# Patient Record
Sex: Female | Born: 1990 | Race: Black or African American | Hispanic: No | Marital: Single | Smoking: Never smoker
Health system: Southern US, Community
[De-identification: ages and names within clinical notes are randomized; demographics above are authoritative.]

---

## 2015-03-30 ENCOUNTER — Emergency Department: Payer: Self-pay

## 2015-03-30 ENCOUNTER — Encounter: Payer: Self-pay | Admitting: Emergency Medicine

## 2015-03-30 ENCOUNTER — Other Ambulatory Visit: Payer: Self-pay

## 2015-03-30 ENCOUNTER — Emergency Department
Admission: EM | Admit: 2015-03-30 | Discharge: 2015-03-30 | Disposition: A | Discharge status: Home / Self Care | Payer: Self-pay | Attending: Emergency Medicine | Admitting: Emergency Medicine

## 2015-03-30 DIAGNOSIS — J069 Acute upper respiratory infection, unspecified: Secondary | ICD-10-CM | POA: Insufficient documentation

## 2015-03-30 LAB — BASIC METABOLIC PANEL
Anion gap: 9 (ref 5–15)
BUN: 10 mg/dL (ref 6–20)
CHLORIDE: 106 mmol/L (ref 101–111)
CO2: 21 mmol/L — AB (ref 22–32)
Calcium: 8.9 mg/dL (ref 8.9–10.3)
Creatinine, Ser: 0.68 mg/dL (ref 0.44–1.00)
GFR calc Af Amer: >60 mL/min (ref >60)
GLUCOSE: 93 mg/dL (ref 65–99)
POTASSIUM: 3.6 mmol/L (ref 3.5–5.1)
Sodium: 136 mmol/L (ref 135–145)

## 2015-03-30 LAB — URINALYSIS COMPLETE WITH MICROSCOPIC (ARMC ONLY)
BILIRUBIN URINE: NEGATIVE
GLUCOSE, UA: NEGATIVE mg/dL
Hgb urine dipstick: NEGATIVE
Leukocytes, UA: NEGATIVE
Nitrite: NEGATIVE
PH: 5 (ref 5.0–8.0)
Protein, ur: 30 mg/dL — AB
Specific Gravity, Urine: 1.035 — ABNORMAL HIGH (ref 1.005–1.030)

## 2015-03-30 LAB — CBC WITH DIFFERENTIAL/PLATELET
Basophils Absolute: 0 10*3/uL (ref 0–0.1)
Basophils Relative: 1 %
EOS PCT: 3 %
Eosinophils Absolute: 0.1 10*3/uL (ref 0–0.7)
HCT: 36.2 % (ref 35.0–47.0)
Hemoglobin: 12.2 g/dL (ref 12.0–16.0)
LYMPHS ABS: 0.9 10*3/uL — AB (ref 1.0–3.6)
LYMPHS PCT: 18 %
MCH: 29.7 pg (ref 26.0–34.0)
MCHC: 33.9 g/dL (ref 32.0–36.0)
MCV: 87.8 fL (ref 80.0–100.0)
MONO ABS: 0.7 10*3/uL (ref 0.2–0.9)
MONOS PCT: 15 %
Neutro Abs: 3.1 10*3/uL (ref 1.4–6.5)
Neutrophils Relative %: 65 %
PLATELETS: 257 10*3/uL (ref 150–440)
RBC: 4.12 MIL/uL (ref 3.80–5.20)
RDW: 13.9 % (ref 11.5–14.5)
WBC: 4.8 10*3/uL (ref 3.6–11.0)

## 2015-03-30 LAB — RAPID INFLUENZA A&B ANTIGENS (ARMC ONLY)
INFLUENZA A (ARMC): NEGATIVE
INFLUENZA B (ARMC): NEGATIVE

## 2015-03-30 LAB — PREGNANCY, URINE: PREG TEST UR: NEGATIVE

## 2015-03-30 MED ORDER — IBUPROFEN 800 MG PO TABS: 800.0000 mg | ORAL_TABLET | Freq: Three times a day (TID) | ORAL | Status: AC | PRN

## 2015-03-30 MED ORDER — GUAIFENESIN-CODEINE 100-10 MG/5ML PO SOLN
10.0000 mL | ORAL | Status: AC | PRN
Start: 2015-03-30 — End: ?

## 2015-03-30 MED ORDER — AZITHROMYCIN 250 MG PO TABS: ORAL_TABLET | ORAL | Status: AC

## 2015-03-30 MED ORDER — FLUCONAZOLE 150 MG PO TABS: 150.0000 mg | ORAL_TABLET | Freq: Every day | ORAL | Status: AC

## 2015-03-30 NOTE — ED Provider Notes (Signed)
[facility] Emergency Department Provider Note  ____________________________________________  Time seen: Approximately 5:31 PM  I have reviewed the triage vital signs and the nursing notes.   HISTORY  Chief Complaint Generalized Body Aches     HPI Isabella Robbins is a 25 y.o. female presents for evaluation of flulike symptoms. Patient states her supervisor said that she "blacked out" today. Thinks that she went out for possibly 5 seconds. She reports 7 fever earlier this week none now. Positive for runny nose congestion. In addition patient states that she does have some vaginal itching with some abdominal cramping last week. States she is sexually active, LMP was 10 days ago.     History reviewed. No pertinent past medical history.  There are no active problems to display for this patient.   History reviewed. No pertinent past surgical history.  Current Outpatient Rx  Name  Route  Sig  Dispense  Refill  . azithromycin (ZITHROMAX Z-PAK) 250 MG tablet      Take 2 tablets (500 mg) on  Day 1,  followed by 1 tablet (250 mg) once daily on Days 2 through 5.   6 each   0   . fluconazole (DIFLUCAN) 150 MG tablet   Oral   Take 1 tablet (150 mg total) by mouth daily.   1 tablet   0   . guaiFENesin-codeine 100-10 MG/5ML syrup   Oral   Take 10 mLs by mouth every 4 (four) hours as needed for cough.   180 mL   0   . ibuprofen (ADVIL,MOTRIN) 800 MG tablet   Oral   Take 1 tablet (800 mg total) by mouth every 8 (eight) hours as needed.   30 tablet   0     Allergies Review of patient's allergies indicates no active allergies.  No family history on file.  Social History Social History  Substance Use Topics  . Smoking status: Never Smoker   . Smokeless tobacco: None  . Alcohol Use: No    Review of Systems  Constitutional: No fever/chills Eyes: No visual changes. ENT: No sore throat. Cardiovascular: Denies chest pain. Respiratory: Denies  shortness of breath.Positive for cough Gastrointestinal: Positive abdominal pain.  No nausea, no vomiting.  No diarrhea.  No constipation. Genitourinary: Positive for dysuria. Denies pregnancy or discharge at this time. Musculoskeletal: Negative for back pain. Positive for generalized muscle aches Skin: Negative for rash. Neurological: Negative for headaches, focal weakness or numbness. Positive for one syncopal episode. Duration unknown probably seconds according to the patient  10-point ROS otherwise negative.  ____________________________________________   PHYSICAL EXAM: BP 105/64 mmHg  Pulse 103  Temp(Src) 98.1 F (36.7 C) (Oral)  Resp 20  Ht 5' (1.524 m)  Wt 88.451 kg  BMI 38.08 kg/m2  SpO2 98%  LMP 03/19/2015  Breastfeeding? Unknown   VITAL SIGNS: ED Triage Vitals  Enc Vitals Group     BP --      Pulse --      Resp --      Temp --      Temp src --      SpO2 --      Weight --      Height --      Head Cir --      Peak Flow --      Pain Score 03/30/15 1725 8     Pain Loc --      Pain Edu? --      Excl. in GC? --  Constitutional: Alert and oriented. Well appearing and in no acute distress. Eyes: Conjunctivae are normal. PERRL. EOMI. Head: Atraumatic. Nose: Positive congestion/rhinnorhea. Mouth/Throat: Mucous membranes are moist.  Oropharynx non-erythematous. Neck: No stridor.Full range of motion nontender   Cardiovascular: Normal rate, regular rhythm. Grossly normal heart sounds.  Good peripheral circulation. Respiratory: Normal respiratory effort.  No retractions. Lungs CTAB. Gastrointestinal: Soft and nontender. No distention. No abdominal bruits. No CVA tenderness. Musculoskeletal: No lower extremity tenderness nor edema.  No joint effusions. Neurologic:  Normal speech and language. No gross focal neurologic deficits are appreciated. No gait instability. Skin:  Skin is warm, dry and intact. No rash noted. Psychiatric: Mood and affect are normal.  Speech and behavior are normal.  ____________________________________________   LABS (all labs ordered are listed, but only abnormal results are displayed)  Labs Reviewed  BASIC METABOLIC PANEL - Abnormal; Notable for the following:    CO2 21 (*)    All other components within normal limits  CBC WITH DIFFERENTIAL/PLATELET - Abnormal; Notable for the following:    Lymphs Abs 0.9 (*)    All other components within normal limits  URINALYSIS COMPLETEWITH MICROSCOPIC ([facility] ONLY) - Abnormal; Notable for the following:    Color, Urine AMBER (*)    APPearance CLOUDY (*)    Ketones, ur TRACE (*)    Specific Gravity, Urine 1.035 (*)    Protein, ur 30 (*)    Bacteria, UA FEW (*)    Squamous Epithelial / LPF 6-30 (*)    All other components within normal limits  RAPID INFLUENZA A&B ANTIGENS ([facility] ONLY)  PREGNANCY, URINE   ____________________________________________  EKG  Normal EKG no acute STEMI normal sinus rhythm.  ____________________________________________  RADIOLOGY  No acute cardiopulmonary findings.  ____________________________________________   PROCEDURES  Procedure(s) performed: None  Critical Care performed: No  ____________________________________________   INITIAL IMPRESSION / ASSESSMENT AND PLAN / ED COURSE  Pertinent labs & imaging results that were available during my care of the patient were reviewed by me and considered in my medical decision making (see chart for details).    ____________________________________________   FINAL CLINICAL IMPRESSION(S) / ED DIAGNOSES  Final diagnoses:  URI, acute     This chart was dictated using voice recognition software/Dragon. Despite best efforts to proofread, errors can occur which can change the meaning. Any change was purely unintentional.   Evangeline Dakinharles M Beers, PA-C 03/30/15 1850  Governor Rooksebecca Lord, MD 03/30/15 [phone]

## 2015-03-30 NOTE — ED Notes (Signed)
Cold sx   Body aches on Monday ..unsure of fever ..states was told that she she had blacked out at work around 430 pm

## 2015-03-30 NOTE — ED Notes (Addendum)
Pt comes into the ED via POV c/o flu like symptoms and her supervisor said that she "blacked out" at work today.  Patient in NAD at this time.

## 2015-03-30 NOTE — Discharge Instructions (Signed)
Upper Respiratory Infection, Adult Most upper respiratory infections (URIs) are a viral infection of the air passages leading to the lungs. A URI affects the nose, throat, and upper air passages. The most common type of URI is nasopharyngitis and is typically referred to as "the common cold." URIs run their course and usually go away on their own. Most of the time, a URI does not require medical attention, but sometimes a bacterial infection in the upper airways can follow a viral infection. This is called a secondary infection. Sinus and middle ear infections are common types of secondary upper respiratory infections. Bacterial pneumonia can also complicate a URI. A URI can worsen asthma and chronic obstructive pulmonary disease (COPD). Sometimes, these complications can require emergency medical care and may be life threatening.  CAUSES Almost all URIs are caused by viruses. A virus is a type of germ and can spread from one person to another.  RISKS FACTORS You may be at risk for a URI if:   You smoke.   You have chronic heart or lung disease.  You have a weakened defense (immune) system.   You are very young or very old.   You have nasal allergies or asthma.  You work in crowded or poorly ventilated areas.  You work in health care facilities or schools. SIGNS AND SYMPTOMS  Symptoms typically develop 2-3 days after you come in contact with a cold virus. Most viral URIs last 7-10 days. However, viral URIs from the influenza virus (flu virus) can last 14-18 days and are typically more severe. Symptoms may include:   Runny or stuffy (congested) nose.   Sneezing.   Cough.   Sore throat.   Headache.   Fatigue.   Fever.   Loss of appetite.   Pain in your forehead, behind your eyes, and over your cheekbones (sinus pain).  Muscle aches.  DIAGNOSIS  Your health care provider may diagnose a URI by:  Physical exam.  Tests to check that your symptoms are not due to  another condition such as:  Strep throat.  Sinusitis.  Pneumonia.  Asthma. TREATMENT  A URI goes away on its own with time. It cannot be cured with medicines, but medicines may be prescribed or recommended to relieve symptoms. Medicines may help:  Reduce your fever.  Reduce your cough.  Relieve nasal congestion. HOME CARE INSTRUCTIONS   Take medicines only as directed by your health care provider.   Gargle warm saltwater or take cough drops to comfort your throat as directed by your health care provider.  Use a warm mist humidifier or inhale steam from a shower to increase air moisture. This may make it easier to breathe.  Drink enough fluid to keep your urine clear or pale yellow.   Eat soups and other clear broths and maintain good nutrition.   Rest as needed.   Return to work when your temperature has returned to normal or as your health care provider advises. You may need to stay home longer to avoid infecting others. You can also use a face mask and careful hand washing to prevent spread of the virus.  Increase the usage of your inhaler if you have asthma.   Do not use any tobacco products, including cigarettes, chewing tobacco, or electronic cigarettes. If you need help quitting, ask your health care provider. PREVENTION  The best way to protect yourself from getting a cold is to practice good hygiene.   Avoid oral or hand contact with people with cold   symptoms.   Wash your hands often if contact occurs.  There is no clear evidence that vitamin C, vitamin E, echinacea, or exercise reduces the chance of developing a cold. However, it is always recommended to get plenty of rest, exercise, and practice good nutrition.  SEEK MEDICAL CARE IF:   You are getting worse rather than better.   Your symptoms are not controlled by medicine.   You have chills.  You have worsening shortness of breath.  You have brown or red mucus.  You have yellow or brown nasal  discharge.  You have pain in your face, especially when you bend forward.  You have a fever.  You have swollen neck glands.  You have pain while swallowing.  You have white areas in the back of your throat. SEEK IMMEDIATE MEDICAL CARE IF:   You have severe or persistent:  Headache.  Ear pain.  Sinus pain.  Chest pain.  You have chronic lung disease and any of the following:  Wheezing.  Prolonged cough.  Coughing up blood.  A change in your usual mucus.  You have a stiff neck.  You have changes in your:  Vision.  Hearing.  Thinking.  Mood. MAKE SURE YOU:   Understand these instructions.  Will watch your condition.  Will get help right away if you are not doing well or get worse.   This information is not intended to replace advice given to you by your health care provider. Make sure you discuss any questions you have with your health care provider.   Document Released: 06/19/2000 Document Revised: 05/10/2014 Document Reviewed: 03/31/2013 Elsevier Interactive Patient Education 2016 [employer]  

## 2017-05-03 IMAGING — CR DG CHEST 2V
2 series · 2 of 2 positions shown · non-contrast
Comparison: None.

CLINICAL DATA: Flu like symptoms for 2 weeks. Syncopal episode at
work today.

EXAM:
CHEST  2 VIEW

[chest pa]
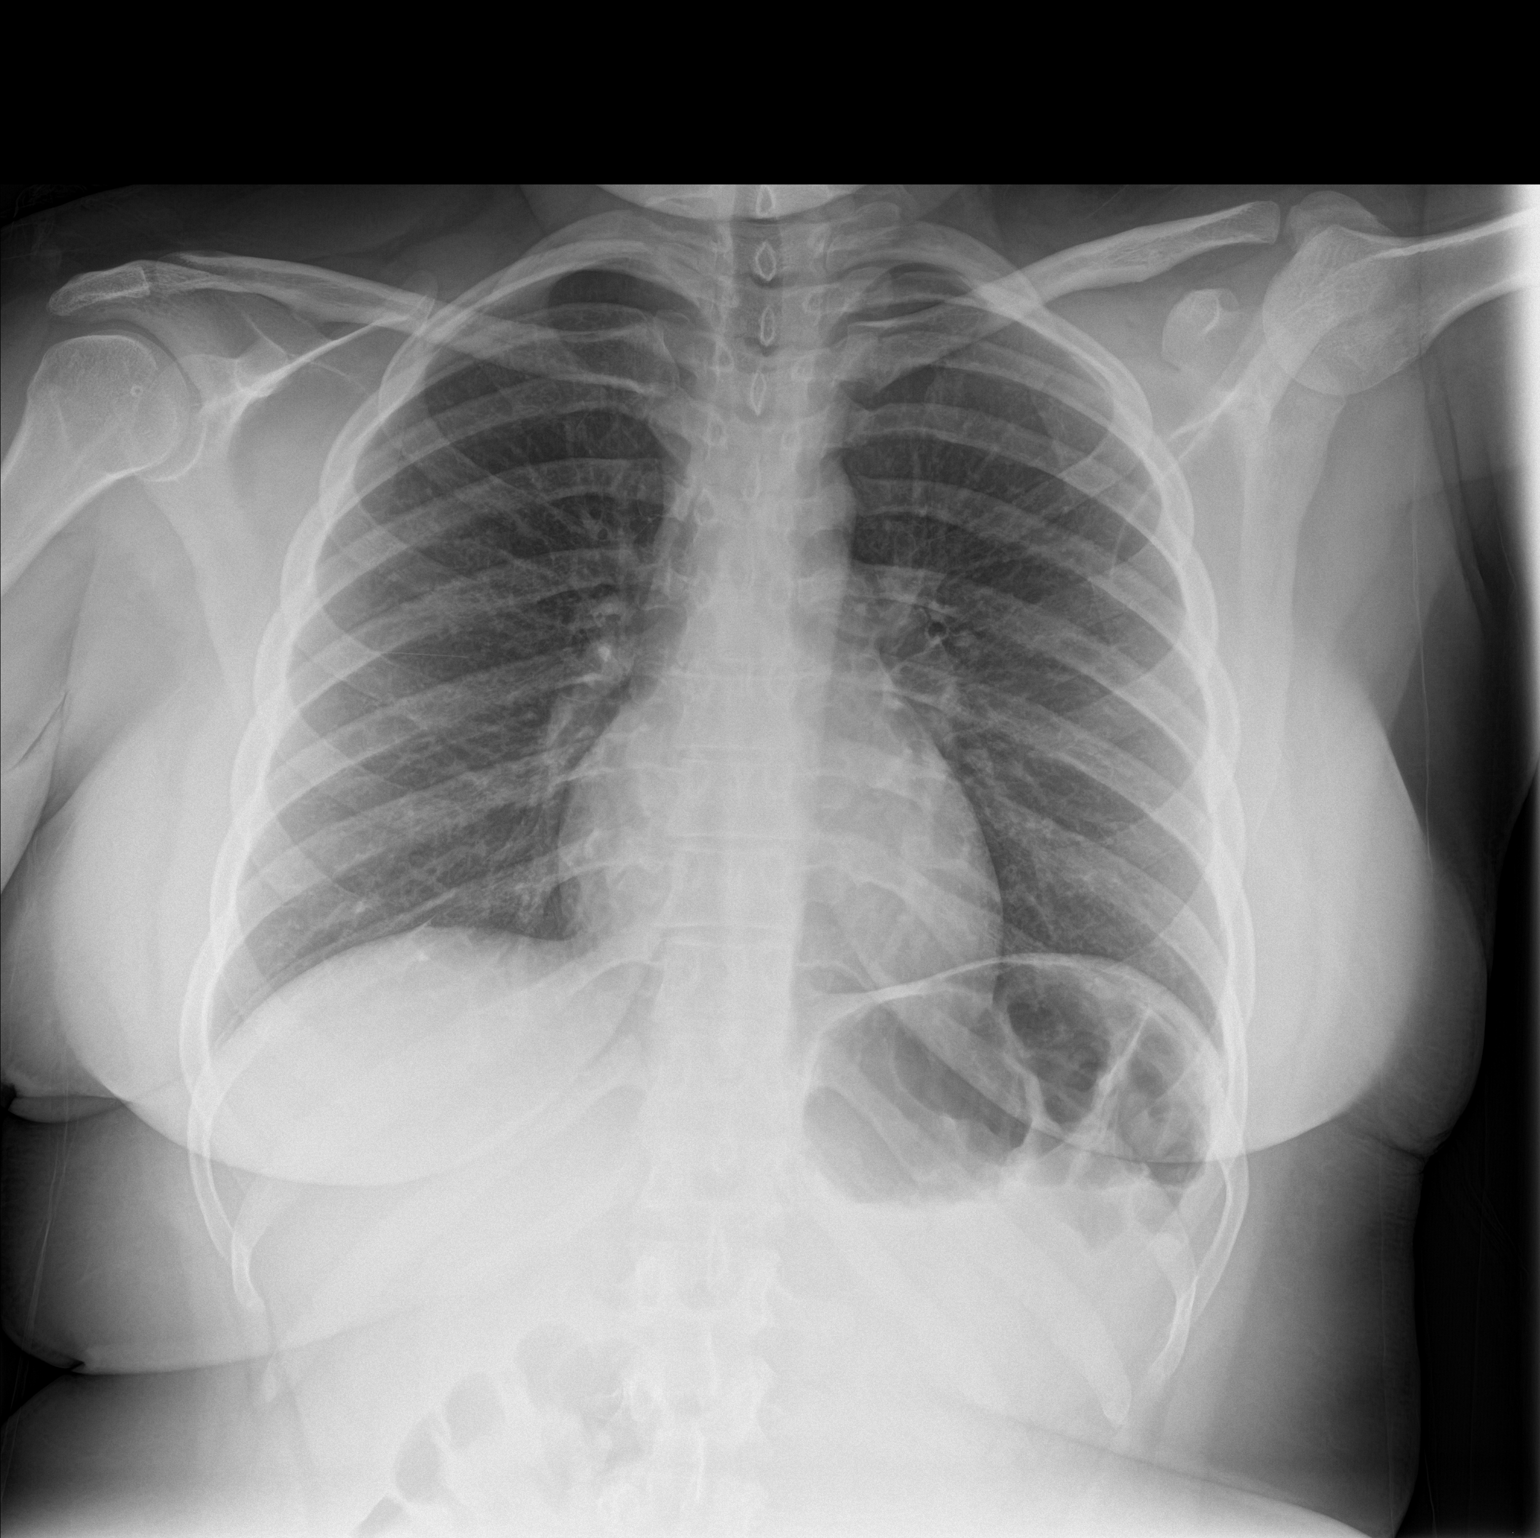

[chest lat]
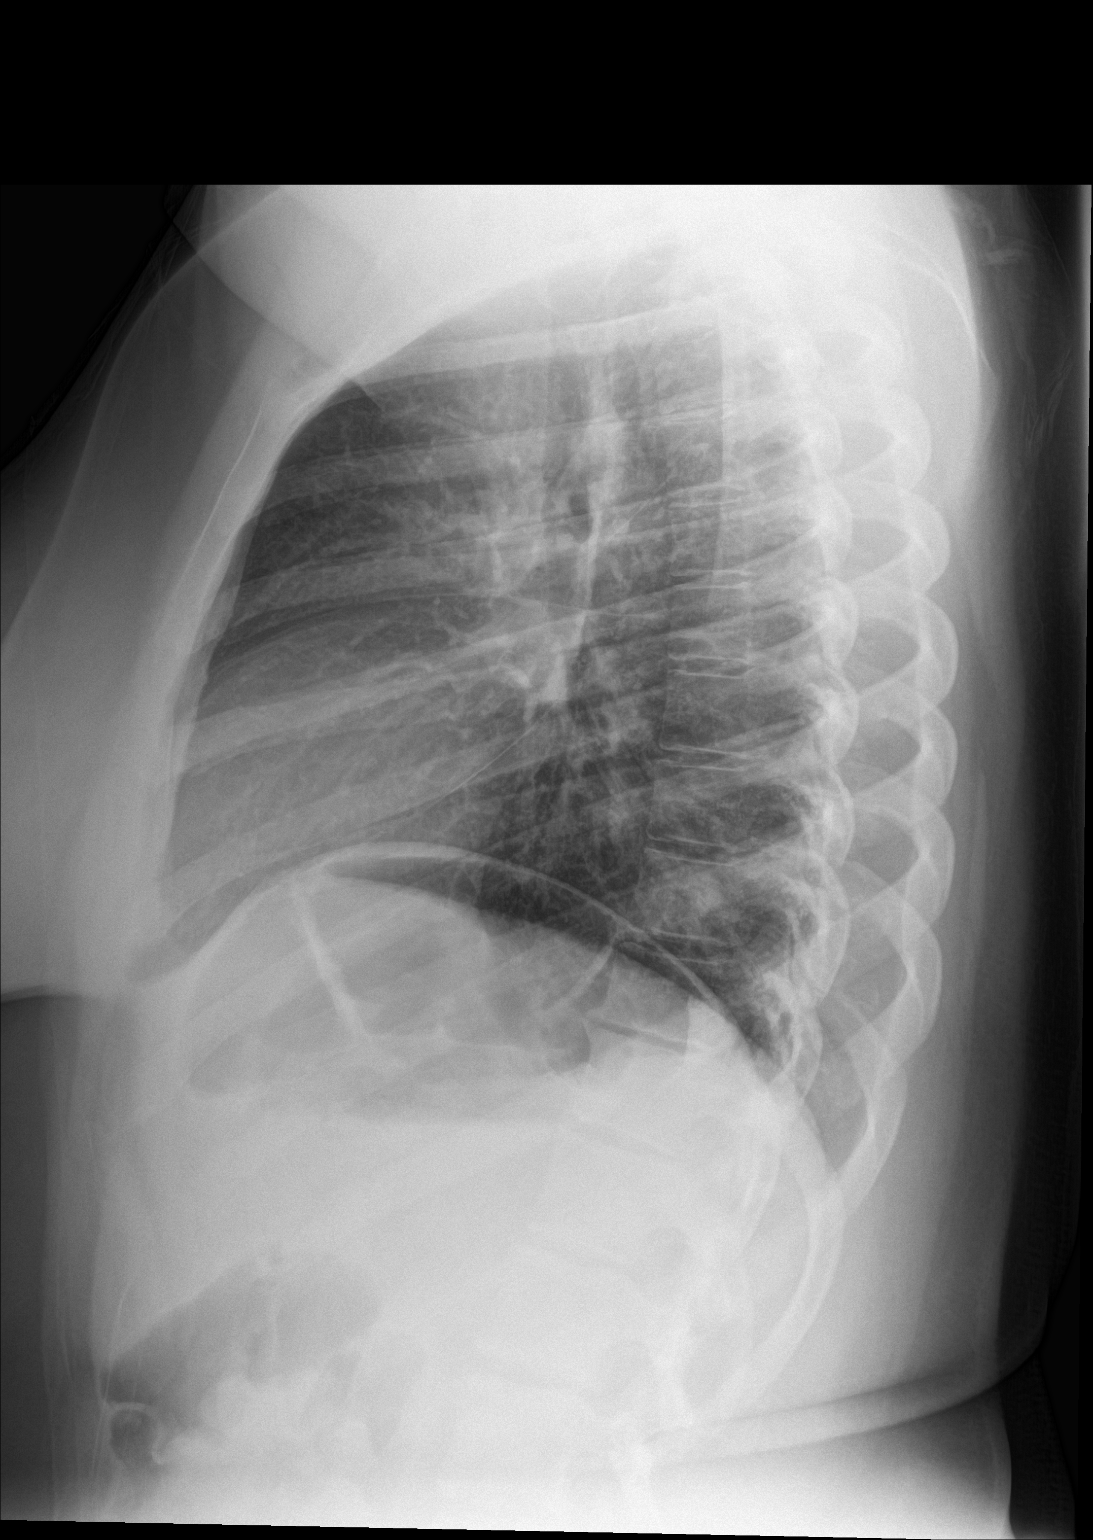

[2 of 2 positions shown; findings below may reference images not displayed]

FINDINGS: The heart size and mediastinal contours are within normal limits.
Both lungs are clear. The visualized skeletal structures are
unremarkable.
IMPRESSION: Normal chest x-ray
